# Patient Record
Sex: Male | Born: 1935 | Race: White | Hispanic: No | Marital: Married
Health system: Southern US, Community
[De-identification: ages and names within clinical notes are randomized; demographics above are authoritative.]

---

## 2005-03-07 ENCOUNTER — Ambulatory Visit: Payer: Self-pay | Admitting: General Practice

## 2005-03-07 ENCOUNTER — Other Ambulatory Visit: Payer: Self-pay

## 2005-03-08 ENCOUNTER — Ambulatory Visit: Payer: Self-pay | Admitting: General Practice

## 2005-03-10 ENCOUNTER — Ambulatory Visit: Payer: Self-pay | Admitting: General Practice

## 2005-03-18 ENCOUNTER — Ambulatory Visit: Payer: Self-pay | Admitting: General Practice

## 2005-08-17 ENCOUNTER — Emergency Department: Payer: Self-pay | Admitting: General Practice

## 2005-10-01 ENCOUNTER — Ambulatory Visit: Payer: Self-pay | Admitting: Internal Medicine

## 2005-10-09 ENCOUNTER — Ambulatory Visit: Payer: Self-pay | Admitting: General Practice

## 2005-10-21 ENCOUNTER — Ambulatory Visit: Payer: Self-pay | Admitting: General Practice

## 2006-11-02 ENCOUNTER — Ambulatory Visit: Payer: Self-pay | Admitting: General Practice

## 2006-11-13 ENCOUNTER — Other Ambulatory Visit: Payer: Self-pay

## 2006-11-13 ENCOUNTER — Ambulatory Visit: Payer: Self-pay | Admitting: Orthopaedic Surgery

## 2006-11-17 ENCOUNTER — Ambulatory Visit: Payer: Self-pay | Admitting: Orthopaedic Surgery

## 2008-08-06 ENCOUNTER — Ambulatory Visit: Payer: Self-pay | Admitting: Family Medicine

## 2009-01-26 ENCOUNTER — Ambulatory Visit: Payer: Self-pay | Admitting: Internal Medicine

## 2009-04-29 ENCOUNTER — Ambulatory Visit: Payer: Self-pay | Admitting: Internal Medicine

## 2009-08-29 ENCOUNTER — Ambulatory Visit: Payer: Self-pay | Admitting: Internal Medicine

## 2011-12-10 ENCOUNTER — Ambulatory Visit: Payer: Self-pay | Admitting: Physician Assistant

## 2011-12-11 ENCOUNTER — Ambulatory Visit: Payer: Self-pay | Admitting: Physician Assistant

## 2011-12-18 ENCOUNTER — Ambulatory Visit: Payer: Self-pay | Admitting: Specialist

## 2011-12-22 ENCOUNTER — Ambulatory Visit: Payer: Self-pay | Admitting: Internal Medicine

## 2011-12-25 ENCOUNTER — Ambulatory Visit: Payer: Self-pay | Admitting: Specialist

## 2011-12-26 LAB — PATHOLOGY REPORT

## 2011-12-30 LAB — CBC CANCER CENTER
Basophil #: 0.1 x10 3/mm (ref 0.0–0.1)
Basophil %: 0.7 %
Eosinophil #: 0.2 x10 3/mm (ref 0.0–0.7)
Eosinophil %: 2.6 %
HGB: 14.2 g/dL (ref 13.0–18.0)
Lymphocyte %: 21.3 %
MCH: 30.7 pg (ref 26.0–34.0)
MCHC: 34.3 g/dL (ref 32.0–36.0)
Monocyte #: 0.5 x10 3/mm (ref 0.2–1.0)
Monocyte %: 6.6 %
Neutrophil #: 5.6 x10 3/mm (ref 1.4–6.5)
Neutrophil %: 68.8 %
RDW: 14.4 % (ref 11.5–14.5)
WBC: 8.2 x10 3/mm (ref 3.8–10.6)

## 2011-12-30 LAB — COMPREHENSIVE METABOLIC PANEL
Anion Gap: 9 (ref 7–16)
BUN: 16 mg/dL (ref 7–18)
Chloride: 102 mmol/L (ref 98–107)
Co2: 28 mmol/L (ref 21–32)
Creatinine: 1.08 mg/dL (ref 0.60–1.30)
EGFR (African American): >60
EGFR (Non-African Amer.): >60
Glucose: 200 mg/dL — ABNORMAL HIGH (ref 65–99)
Potassium: 4.6 mmol/L (ref 3.5–5.1)
SGOT(AST): 78 U/L — ABNORMAL HIGH (ref 15–37)
SGPT (ALT): 74 U/L
Sodium: 139 mmol/L (ref 136–145)

## 2011-12-30 LAB — LACTATE DEHYDROGENASE: LDH: 491 U/L — ABNORMAL HIGH (ref 81–234)

## 2011-12-30 LAB — APTT: Activated PTT: 27.2 secs (ref 23.6–35.9)

## 2012-01-01 ENCOUNTER — Ambulatory Visit: Payer: Self-pay | Admitting: Internal Medicine

## 2012-01-07 LAB — CBC CANCER CENTER
Basophil %: 0.6 %
Eosinophil %: 6.8 %
HGB: 13.3 g/dL (ref 13.0–18.0)
Lymphocyte %: 59.5 %
MCV: 91 fL (ref 80–100)
Neutrophil %: 31.3 %
RBC: 4.36 10*6/uL — ABNORMAL LOW (ref 4.40–5.90)
WBC: 1.8 x10 3/mm — CL (ref 3.8–10.6)

## 2012-01-07 LAB — CREATININE, SERUM
EGFR (African American): >60
EGFR (Non-African Amer.): >60

## 2012-01-14 LAB — CBC CANCER CENTER
Basophil %: 0.6 %
HCT: 36 % — ABNORMAL LOW (ref 40.0–52.0)
HGB: 12.2 g/dL — ABNORMAL LOW (ref 13.0–18.0)
Lymphocyte %: 32.9 %
MCHC: 33.9 g/dL (ref 32.0–36.0)
Monocyte %: 11.6 %
Neutrophil #: 2.4 x10 3/mm (ref 1.4–6.5)
Neutrophil %: 50.6 %
RBC: 4.02 10*6/uL — ABNORMAL LOW (ref 4.40–5.90)
WBC: 4.7 x10 3/mm (ref 3.8–10.6)

## 2012-01-14 LAB — BASIC METABOLIC PANEL
Calcium, Total: 9 mg/dL (ref 8.5–10.1)
Chloride: 101 mmol/L (ref 98–107)
Co2: 30 mmol/L (ref 21–32)
EGFR (Non-African Amer.): >60
Glucose: 203 mg/dL — ABNORMAL HIGH (ref 65–99)
Osmolality: 280 (ref 275–301)
Potassium: 3.8 mmol/L (ref 3.5–5.1)
Sodium: 137 mmol/L (ref 136–145)

## 2012-01-14 LAB — HEPATIC FUNCTION PANEL A (ARMC)
Albumin: 3.4 g/dL (ref 3.4–5.0)
Alkaline Phosphatase: 83 U/L (ref 50–136)
Bilirubin, Direct: 0.1 mg/dL (ref 0.00–0.20)
Bilirubin,Total: 0.4 mg/dL (ref 0.2–1.0)
Total Protein: 7.1 g/dL (ref 6.4–8.2)

## 2012-01-21 LAB — CBC CANCER CENTER
Basophil #: 0 x10 3/mm (ref 0.0–0.1)
Basophil %: 0.8 %
Eosinophil %: 0.5 %
HCT: 34.3 % — ABNORMAL LOW (ref 40.0–52.0)
HGB: 11.5 g/dL — ABNORMAL LOW (ref 13.0–18.0)
Lymphocyte %: 21.6 %
MCH: 30 pg (ref 26.0–34.0)
MCV: 90 fL (ref 80–100)
Monocyte #: 0.8 x10 3/mm (ref 0.2–1.0)
Monocyte %: 15.2 %
Neutrophil #: 3.4 x10 3/mm (ref 1.4–6.5)
Platelet: 101 x10 3/mm — ABNORMAL LOW (ref 150–440)
RBC: 3.83 10*6/uL — ABNORMAL LOW (ref 4.40–5.90)
RDW: 14.4 % (ref 11.5–14.5)
WBC: 5.6 x10 3/mm (ref 3.8–10.6)

## 2012-01-21 LAB — BASIC METABOLIC PANEL
Calcium, Total: 8.9 mg/dL (ref 8.5–10.1)
Chloride: 102 mmol/L (ref 98–107)
Co2: 26 mmol/L (ref 21–32)
Creatinine: 1.27 mg/dL (ref 0.60–1.30)
Sodium: 137 mmol/L (ref 136–145)

## 2012-01-28 LAB — CBC CANCER CENTER
Basophil #: 0 x10 3/mm (ref 0.0–0.1)
Basophil %: 1 %
Eosinophil #: 0 x10 3/mm (ref 0.0–0.7)
HGB: 11.1 g/dL — ABNORMAL LOW (ref 13.0–18.0)
Lymphocyte #: 1 x10 3/mm (ref 1.0–3.6)
MCH: 30.9 pg (ref 26.0–34.0)
MCHC: 34.1 g/dL (ref 32.0–36.0)
MCV: 91 fL (ref 80–100)
Monocyte #: 0.4 x10 3/mm (ref 0.2–1.0)
Neutrophil %: 65.2 %

## 2012-02-01 ENCOUNTER — Ambulatory Visit: Payer: Self-pay | Admitting: Internal Medicine

## 2012-02-04 LAB — BASIC METABOLIC PANEL
Anion Gap: 9 (ref 7–16)
Calcium, Total: 8.7 mg/dL (ref 8.5–10.1)
Chloride: 100 mmol/L (ref 98–107)
Co2: 25 mmol/L (ref 21–32)
EGFR (African American): >60
Sodium: 134 mmol/L — ABNORMAL LOW (ref 136–145)

## 2012-02-04 LAB — HEPATIC FUNCTION PANEL A (ARMC)
Albumin: 3.3 g/dL — ABNORMAL LOW (ref 3.4–5.0)
Alkaline Phosphatase: 96 U/L (ref 50–136)
Bilirubin, Direct: 0.1 mg/dL (ref 0.00–0.20)
Bilirubin,Total: 0.5 mg/dL (ref 0.2–1.0)
SGPT (ALT): 106 U/L — ABNORMAL HIGH (ref 12–78)

## 2012-02-04 LAB — CBC CANCER CENTER
Eosinophil #: 0 x10 3/mm (ref 0.0–0.7)
HCT: 33.5 % — ABNORMAL LOW (ref 40.0–52.0)
Lymphocyte #: 1.6 x10 3/mm (ref 1.0–3.6)
MCH: 30.8 pg (ref 26.0–34.0)
MCHC: 34 g/dL (ref 32.0–36.0)
MCV: 91 fL (ref 80–100)
Monocyte #: 0.7 x10 3/mm (ref 0.2–1.0)
Neutrophil #: 4.6 x10 3/mm (ref 1.4–6.5)
Platelet: 71 x10 3/mm — ABNORMAL LOW (ref 150–440)
RDW: 15.5 % — ABNORMAL HIGH (ref 11.5–14.5)
WBC: 7 x10 3/mm (ref 3.8–10.6)

## 2012-02-11 LAB — BASIC METABOLIC PANEL
Anion Gap: 8 (ref 7–16)
BUN: 18 mg/dL (ref 7–18)
Chloride: 101 mmol/L (ref 98–107)
Creatinine: 1.2 mg/dL (ref 0.60–1.30)
EGFR (African American): >60
EGFR (Non-African Amer.): 58 — ABNORMAL LOW
Glucose: 213 mg/dL — ABNORMAL HIGH (ref 65–99)
Osmolality: 282 (ref 275–301)
Potassium: 4 mmol/L (ref 3.5–5.1)

## 2012-02-11 LAB — CBC CANCER CENTER
Basophil #: 0.1 x10 3/mm (ref 0.0–0.1)
Basophil %: 0.9 %
Eosinophil %: 0.9 %
HCT: 34.5 % — ABNORMAL LOW (ref 40.0–52.0)
HGB: 11.4 g/dL — ABNORMAL LOW (ref 13.0–18.0)
Lymphocyte %: 20.7 %
MCH: 30.4 pg (ref 26.0–34.0)
MCHC: 33.2 g/dL (ref 32.0–36.0)
Monocyte %: 11.9 %
Neutrophil #: 5.7 x10 3/mm (ref 1.4–6.5)
Neutrophil %: 65.6 %
RBC: 3.76 10*6/uL — ABNORMAL LOW (ref 4.40–5.90)
WBC: 8.7 x10 3/mm (ref 3.8–10.6)

## 2012-02-18 LAB — CBC CANCER CENTER
Basophil #: 0.1 x10 3/mm (ref 0.0–0.1)
Basophil %: 1 %
Eosinophil #: 0.1 x10 3/mm (ref 0.0–0.7)
Eosinophil %: 0.8 %
HCT: 32.5 % — ABNORMAL LOW (ref 40.0–52.0)
HGB: 10.9 g/dL — ABNORMAL LOW (ref 13.0–18.0)
Lymphocyte #: 1.3 x10 3/mm (ref 1.0–3.6)
Lymphocyte %: 16.4 %
MCH: 31 pg (ref 26.0–34.0)
MCV: 92 fL (ref 80–100)
Monocyte #: 0.7 x10 3/mm (ref 0.2–1.0)
Monocyte %: 9.4 %
Neutrophil #: 5.5 x10 3/mm (ref 1.4–6.5)
Platelet: 84 x10 3/mm — ABNORMAL LOW (ref 150–440)
RBC: 3.52 10*6/uL — ABNORMAL LOW (ref 4.40–5.90)
WBC: 7.7 x10 3/mm (ref 3.8–10.6)

## 2012-02-18 LAB — BASIC METABOLIC PANEL
Anion Gap: 6 — ABNORMAL LOW (ref 7–16)
Calcium, Total: 8.8 mg/dL (ref 8.5–10.1)
Chloride: 100 mmol/L (ref 98–107)
Co2: 30 mmol/L (ref 21–32)
EGFR (African American): >60
EGFR (Non-African Amer.): 53 — ABNORMAL LOW
Glucose: 282 mg/dL — ABNORMAL HIGH (ref 65–99)
Osmolality: 283 (ref 275–301)

## 2012-02-25 LAB — CBC CANCER CENTER
Basophil #: 0.1 x10 3/mm (ref 0.0–0.1)
Basophil %: 1.3 %
Eosinophil #: 0.1 x10 3/mm (ref 0.0–0.7)
HCT: 33 % — ABNORMAL LOW (ref 40.0–52.0)
HGB: 11 g/dL — ABNORMAL LOW (ref 13.0–18.0)
Lymphocyte %: 21.2 %
MCH: 31.2 pg (ref 26.0–34.0)
MCHC: 33.3 g/dL (ref 32.0–36.0)
MCV: 94 fL (ref 80–100)
Monocyte %: 9.9 %
Neutrophil #: 4.5 x10 3/mm (ref 1.4–6.5)
Neutrophil %: 65.6 %
Platelet: 133 x10 3/mm — ABNORMAL LOW (ref 150–440)
RDW: 19.4 % — ABNORMAL HIGH (ref 11.5–14.5)
WBC: 6.9 x10 3/mm (ref 3.8–10.6)

## 2012-02-25 LAB — BASIC METABOLIC PANEL
Anion Gap: 10 (ref 7–16)
Chloride: 101 mmol/L (ref 98–107)
Co2: 27 mmol/L (ref 21–32)
Creatinine: 1.19 mg/dL (ref 0.60–1.30)
Osmolality: 282 (ref 275–301)
Potassium: 4.1 mmol/L (ref 3.5–5.1)

## 2012-03-02 ENCOUNTER — Ambulatory Visit: Payer: Self-pay | Admitting: Internal Medicine

## 2012-03-03 LAB — CBC CANCER CENTER
Basophil #: 0 x10 3/mm (ref 0.0–0.1)
Eosinophil %: 6.8 %
HGB: 10.8 g/dL — ABNORMAL LOW (ref 13.0–18.0)
Lymphocyte #: 1.1 x10 3/mm (ref 1.0–3.6)
Monocyte #: 0.1 x10 3/mm — ABNORMAL LOW (ref 0.2–1.0)
Monocyte %: 2.3 %
Neutrophil %: 37.7 %
Platelet: 50 x10 3/mm — ABNORMAL LOW (ref 150–440)
RBC: 3.41 10*6/uL — ABNORMAL LOW (ref 4.40–5.90)
RDW: 18.2 % — ABNORMAL HIGH (ref 11.5–14.5)
WBC: 2.2 x10 3/mm — ABNORMAL LOW (ref 3.8–10.6)

## 2012-03-10 LAB — CBC CANCER CENTER
Eosinophil #: 0.1 x10 3/mm (ref 0.0–0.7)
HCT: 27.8 % — ABNORMAL LOW (ref 40.0–52.0)
Lymphocyte #: 1.2 x10 3/mm (ref 1.0–3.6)
Lymphocyte %: 44.9 %
MCH: 32.9 pg (ref 26.0–34.0)
MCHC: 34.3 g/dL (ref 32.0–36.0)
MCV: 96 fL (ref 80–100)
Monocyte #: 0.3 x10 3/mm (ref 0.2–1.0)
Monocyte %: 9.4 %
Neutrophil #: 1.1 x10 3/mm — ABNORMAL LOW (ref 1.4–6.5)
Platelet: 26 x10 3/mm — CL (ref 150–440)
RDW: 18.3 % — ABNORMAL HIGH (ref 11.5–14.5)
WBC: 2.7 x10 3/mm — ABNORMAL LOW (ref 3.8–10.6)

## 2012-03-10 LAB — BASIC METABOLIC PANEL
Anion Gap: 10 (ref 7–16)
BUN: 14 mg/dL (ref 7–18)
Chloride: 100 mmol/L (ref 98–107)
Co2: 28 mmol/L (ref 21–32)
Creatinine: 1.17 mg/dL (ref 0.60–1.30)
EGFR (Non-African Amer.): 59 — ABNORMAL LOW
Osmolality: 292 (ref 275–301)
Potassium: 4.2 mmol/L (ref 3.5–5.1)

## 2012-03-10 LAB — HEPATIC FUNCTION PANEL A (ARMC)
Albumin: 3.3 g/dL — ABNORMAL LOW (ref 3.4–5.0)
Alkaline Phosphatase: 79 U/L (ref 50–136)
SGOT(AST): 49 U/L — ABNORMAL HIGH (ref 15–37)
Total Protein: 6.9 g/dL (ref 6.4–8.2)

## 2012-03-17 LAB — CBC CANCER CENTER
Basophil #: 0 x10 3/mm (ref 0.0–0.1)
Basophil %: 0.8 %
Eosinophil #: 0 x10 3/mm (ref 0.0–0.7)
Lymphocyte #: 1.8 x10 3/mm (ref 1.0–3.6)
MCH: 31.9 pg (ref 26.0–34.0)
MCHC: 33.4 g/dL (ref 32.0–36.0)
MCV: 95 fL (ref 80–100)
Monocyte #: 0.9 x10 3/mm (ref 0.2–1.0)
Neutrophil %: 51.6 %
Platelet: 86 x10 3/mm — ABNORMAL LOW (ref 150–440)
RDW: 19.2 % — ABNORMAL HIGH (ref 11.5–14.5)

## 2012-03-17 LAB — BASIC METABOLIC PANEL
Calcium, Total: 9 mg/dL (ref 8.5–10.1)
Chloride: 100 mmol/L (ref 98–107)
Co2: 21 mmol/L (ref 21–32)
Glucose: 286 mg/dL — ABNORMAL HIGH (ref 65–99)
Osmolality: 286 (ref 275–301)
Potassium: 4.3 mmol/L (ref 3.5–5.1)
Sodium: 137 mmol/L (ref 136–145)

## 2012-03-24 LAB — BASIC METABOLIC PANEL
Calcium, Total: 8.8 mg/dL (ref 8.5–10.1)
Chloride: 98 mmol/L (ref 98–107)
Creatinine: 1.17 mg/dL (ref 0.60–1.30)
EGFR (African American): >60
EGFR (Non-African Amer.): 59 — ABNORMAL LOW
Glucose: 300 mg/dL — ABNORMAL HIGH (ref 65–99)
Osmolality: 278 (ref 275–301)
Potassium: 4.1 mmol/L (ref 3.5–5.1)

## 2012-03-24 LAB — CBC CANCER CENTER
Basophil #: 0.1 x10 3/mm (ref 0.0–0.1)
Eosinophil #: 0 x10 3/mm (ref 0.0–0.7)
Eosinophil %: 0.3 %
HCT: 29.8 % — ABNORMAL LOW (ref 40.0–52.0)
HGB: 10.2 g/dL — ABNORMAL LOW (ref 13.0–18.0)
Lymphocyte %: 27.6 %
MCH: 33.3 pg (ref 26.0–34.0)
MCHC: 34.3 g/dL (ref 32.0–36.0)
MCV: 97 fL (ref 80–100)
Monocyte #: 0.9 x10 3/mm (ref 0.2–1.0)
Neutrophil #: 3.3 x10 3/mm (ref 1.4–6.5)
Neutrophil %: 56.3 %
Platelet: 139 x10 3/mm — ABNORMAL LOW (ref 150–440)
RBC: 3.06 10*6/uL — ABNORMAL LOW (ref 4.40–5.90)
RDW: 20.8 % — ABNORMAL HIGH (ref 11.5–14.5)
WBC: 5.9 x10 3/mm (ref 3.8–10.6)

## 2012-03-31 LAB — CBC CANCER CENTER
Basophil #: 0 x10 3/mm (ref 0.0–0.1)
Basophil %: 0.6 %
Eosinophil #: 0 x10 3/mm (ref 0.0–0.7)
HCT: 30.3 % — ABNORMAL LOW (ref 40.0–52.0)
HGB: 10.3 g/dL — ABNORMAL LOW (ref 13.0–18.0)
MCH: 33.2 pg (ref 26.0–34.0)
MCHC: 33.9 g/dL (ref 32.0–36.0)
Monocyte #: 0.1 x10 3/mm — ABNORMAL LOW (ref 0.2–1.0)
Monocyte %: 3.2 %
Neutrophil %: 64.8 %
Platelet: 77 x10 3/mm — ABNORMAL LOW (ref 150–440)
RDW: 18.5 % — ABNORMAL HIGH (ref 11.5–14.5)
WBC: 3.6 x10 3/mm — ABNORMAL LOW (ref 3.8–10.6)

## 2012-04-02 ENCOUNTER — Ambulatory Visit: Payer: Self-pay | Admitting: Internal Medicine

## 2012-04-07 LAB — CBC CANCER CENTER
Basophil #: 0 x10 3/mm (ref 0.0–0.1)
Eosinophil #: 0 x10 3/mm (ref 0.0–0.7)
Eosinophil %: 0.3 %
Lymphocyte %: 16.6 %
MCHC: 32.9 g/dL (ref 32.0–36.0)
MCV: 100 fL (ref 80–100)
Monocyte #: 0.3 x10 3/mm (ref 0.2–1.0)
Neutrophil #: 5.9 x10 3/mm (ref 1.4–6.5)
Neutrophil %: 78.4 %
RDW: 18.6 % — ABNORMAL HIGH (ref 11.5–14.5)
WBC: 7.6 x10 3/mm (ref 3.8–10.6)

## 2012-04-07 LAB — HEPATIC FUNCTION PANEL A (ARMC)
Albumin: 3.5 g/dL (ref 3.4–5.0)
Alkaline Phosphatase: 73 U/L (ref 50–136)
Bilirubin, Direct: 0.2 mg/dL (ref 0.00–0.20)
Bilirubin,Total: 0.5 mg/dL (ref 0.2–1.0)
SGOT(AST): 50 U/L — ABNORMAL HIGH (ref 15–37)
Total Protein: 7 g/dL (ref 6.4–8.2)

## 2012-04-07 LAB — BASIC METABOLIC PANEL
Anion Gap: 14 (ref 7–16)
BUN: 21 mg/dL — ABNORMAL HIGH (ref 7–18)
Calcium, Total: 8.9 mg/dL (ref 8.5–10.1)
Creatinine: 1.43 mg/dL — ABNORMAL HIGH (ref 0.60–1.30)
EGFR (African American): 54 — ABNORMAL LOW
Glucose: 361 mg/dL — ABNORMAL HIGH (ref 65–99)
Osmolality: 288 (ref 275–301)

## 2012-04-14 LAB — CBC CANCER CENTER
Basophil %: 0.7 %
Eosinophil #: 0 x10 3/mm (ref 0.0–0.7)
HCT: 29.7 % — ABNORMAL LOW (ref 40.0–52.0)
HGB: 10 g/dL — ABNORMAL LOW (ref 13.0–18.0)
Lymphocyte #: 1.7 x10 3/mm (ref 1.0–3.6)
Lymphocyte %: 21.6 %
MCHC: 33.8 g/dL (ref 32.0–36.0)
MCV: 100 fL (ref 80–100)
Monocyte %: 12.6 %
Neutrophil #: 5.1 x10 3/mm (ref 1.4–6.5)
WBC: 8 x10 3/mm (ref 3.8–10.6)

## 2012-04-21 LAB — CBC CANCER CENTER
Eosinophil %: 0.2 %
HCT: 31.7 % — ABNORMAL LOW (ref 40.0–52.0)
HGB: 10.4 g/dL — ABNORMAL LOW (ref 13.0–18.0)
Lymphocyte #: 1.6 x10 3/mm (ref 1.0–3.6)
MCH: 33.2 pg (ref 26.0–34.0)
MCHC: 32.9 g/dL (ref 32.0–36.0)
MCV: 101 fL — ABNORMAL HIGH (ref 80–100)
Monocyte #: 0.9 x10 3/mm (ref 0.2–1.0)
Monocyte %: 10.6 %
Neutrophil #: 5.6 x10 3/mm (ref 1.4–6.5)
RBC: 3.14 10*6/uL — ABNORMAL LOW (ref 4.40–5.90)

## 2012-04-21 LAB — BASIC METABOLIC PANEL
Anion Gap: 10 (ref 7–16)
Calcium, Total: 9.9 mg/dL (ref 8.5–10.1)
Chloride: 100 mmol/L (ref 98–107)
Co2: 26 mmol/L (ref 21–32)
Creatinine: 1.14 mg/dL (ref 0.60–1.30)
Sodium: 136 mmol/L (ref 136–145)

## 2012-05-02 ENCOUNTER — Ambulatory Visit: Payer: Self-pay | Admitting: Internal Medicine

## 2012-05-03 LAB — CBC CANCER CENTER
Basophil %: 0.7 %
Eosinophil #: 0.1 x10 3/mm (ref 0.0–0.7)
Lymphocyte #: 1.5 x10 3/mm (ref 1.0–3.6)
MCH: 33.2 pg (ref 26.0–34.0)
MCHC: 33.2 g/dL (ref 32.0–36.0)
MCV: 100 fL (ref 80–100)
Monocyte #: 0.5 x10 3/mm (ref 0.2–1.0)
Monocyte %: 7.5 %
Neutrophil #: 5 x10 3/mm (ref 1.4–6.5)
Neutrophil %: 69.1 %
Platelet: 117 x10 3/mm — ABNORMAL LOW (ref 150–440)

## 2012-05-03 LAB — BASIC METABOLIC PANEL
Anion Gap: 10 (ref 7–16)
BUN: 22 mg/dL — ABNORMAL HIGH (ref 7–18)
Calcium, Total: 9.5 mg/dL (ref 8.5–10.1)
Chloride: 102 mmol/L (ref 98–107)
Creatinine: 1.15 mg/dL (ref 0.60–1.30)
EGFR (African American): >60
EGFR (Non-African Amer.): >60
Glucose: 220 mg/dL — ABNORMAL HIGH (ref 65–99)

## 2012-05-10 LAB — CBC CANCER CENTER
Basophil #: 0 x10 3/mm (ref 0.0–0.1)
Basophil %: 1.2 %
Eosinophil #: 0.1 x10 3/mm (ref 0.0–0.7)
Eosinophil %: 4.1 %
HCT: 29.8 % — ABNORMAL LOW (ref 40.0–52.0)
HGB: 10.1 g/dL — ABNORMAL LOW (ref 13.0–18.0)
Lymphocyte #: 1.1 x10 3/mm (ref 1.0–3.6)
Lymphocyte %: 38.4 %
MCV: 98 fL (ref 80–100)
Monocyte %: 1.9 %
Neutrophil #: 1.6 x10 3/mm (ref 1.4–6.5)
Neutrophil %: 54.4 %
RBC: 3.04 10*6/uL — ABNORMAL LOW (ref 4.40–5.90)
RDW: 16.2 % — ABNORMAL HIGH (ref 11.5–14.5)
WBC: 2.9 x10 3/mm — ABNORMAL LOW (ref 3.8–10.6)

## 2012-05-17 LAB — BASIC METABOLIC PANEL
Anion Gap: 7 (ref 7–16)
BUN: 18 mg/dL (ref 7–18)
Co2: 32 mmol/L (ref 21–32)
Creatinine: 1.18 mg/dL (ref 0.60–1.30)
EGFR (Non-African Amer.): 59 — ABNORMAL LOW
Glucose: 217 mg/dL — ABNORMAL HIGH (ref 65–99)
Osmolality: 284 (ref 275–301)
Potassium: 4.1 mmol/L (ref 3.5–5.1)
Sodium: 138 mmol/L (ref 136–145)

## 2012-05-17 LAB — HEPATIC FUNCTION PANEL A (ARMC)
Albumin: 3.7 g/dL (ref 3.4–5.0)
Bilirubin, Direct: 0.2 mg/dL (ref 0.00–0.20)
Bilirubin,Total: 0.6 mg/dL (ref 0.2–1.0)
SGOT(AST): 103 U/L — ABNORMAL HIGH (ref 15–37)
Total Protein: 7.6 g/dL (ref 6.4–8.2)

## 2012-05-17 LAB — CBC CANCER CENTER
Eosinophil %: 2.4 %
Lymphocyte #: 1.6 x10 3/mm (ref 1.0–3.6)
Lymphocyte %: 38 %
MCV: 97 fL (ref 80–100)
Monocyte %: 10.1 %
Neutrophil %: 48.6 %
Platelet: 21 x10 3/mm — CL (ref 150–440)
RBC: 2.97 10*6/uL — ABNORMAL LOW (ref 4.40–5.90)
RDW: 15.9 % — ABNORMAL HIGH (ref 11.5–14.5)
WBC: 4.3 x10 3/mm (ref 3.8–10.6)

## 2012-05-24 LAB — CBC CANCER CENTER
Basophil #: 0 x10 3/mm (ref 0.0–0.1)
Eosinophil %: 0.6 %
HCT: 27.4 % — ABNORMAL LOW (ref 40.0–52.0)
Lymphocyte #: 2 x10 3/mm (ref 1.0–3.6)
Lymphocyte %: 30.6 %
MCH: 34.1 pg — ABNORMAL HIGH (ref 26.0–34.0)
MCV: 98 fL (ref 80–100)
Monocyte #: 0.6 x10 3/mm (ref 0.2–1.0)
Monocyte %: 8.8 %
Neutrophil %: 59.6 %
Platelet: 41 x10 3/mm — ABNORMAL LOW (ref 150–440)
RBC: 2.81 10*6/uL — ABNORMAL LOW (ref 4.40–5.90)
RDW: 16.5 % — ABNORMAL HIGH (ref 11.5–14.5)
WBC: 6.5 x10 3/mm (ref 3.8–10.6)

## 2012-05-29 ENCOUNTER — Ambulatory Visit: Payer: Self-pay | Admitting: Family Medicine

## 2012-05-29 ENCOUNTER — Emergency Department: Payer: Self-pay | Admitting: Unknown Physician Specialty

## 2012-05-29 LAB — URINALYSIS, COMPLETE
Bacteria: NEGATIVE
Bilirubin,UR: NEGATIVE
Ketone: NEGATIVE
Protein: NEGATIVE

## 2012-05-29 LAB — CBC WITH DIFFERENTIAL/PLATELET
Basophil #: 0 10*3/uL (ref 0.0–0.1)
Basophil %: 0.4 %
HGB: 9.1 g/dL — ABNORMAL LOW (ref 13.0–18.0)
Lymphocyte #: 1.5 10*3/uL (ref 1.0–3.6)
MCH: 33.6 pg (ref 26.0–34.0)
MCHC: 33.9 g/dL (ref 32.0–36.0)
MCV: 99 fL (ref 80–100)
Neutrophil %: 62.7 %
Platelet: 85 10*3/uL — ABNORMAL LOW (ref 150–440)

## 2012-05-29 LAB — COMPREHENSIVE METABOLIC PANEL
Alkaline Phosphatase: 85 U/L (ref 50–136)
Anion Gap: 8 (ref 7–16)
BUN: 17 mg/dL (ref 7–18)
Bilirubin,Total: 0.7 mg/dL (ref 0.2–1.0)
Calcium, Total: 9.3 mg/dL (ref 8.5–10.1)
Co2: 27 mmol/L (ref 21–32)
Creatinine: 1.07 mg/dL (ref 0.60–1.30)
EGFR (African American): >60
Glucose: 333 mg/dL — ABNORMAL HIGH (ref 65–99)
Osmolality: 281 (ref 275–301)
Potassium: 4.6 mmol/L (ref 3.5–5.1)
SGPT (ALT): 73 U/L (ref 12–78)
Sodium: 133 mmol/L — ABNORMAL LOW (ref 136–145)

## 2012-05-29 LAB — MAGNESIUM: Magnesium: 1.8 mg/dL

## 2012-05-29 LAB — TROPONIN I
Troponin-I: <0.02 ng/mL
Troponin-I: <0.02 ng/mL

## 2012-06-02 ENCOUNTER — Ambulatory Visit: Payer: Self-pay | Admitting: Internal Medicine

## 2012-06-07 LAB — BASIC METABOLIC PANEL
Anion Gap: 12 (ref 7–16)
BUN: 22 mg/dL — ABNORMAL HIGH (ref 7–18)
Calcium, Total: 9 mg/dL (ref 8.5–10.1)
Chloride: 100 mmol/L (ref 98–107)
Creatinine: 1.27 mg/dL (ref 0.60–1.30)
EGFR (African American): >60
EGFR (Non-African Amer.): 54 — ABNORMAL LOW
Glucose: 271 mg/dL — ABNORMAL HIGH (ref 65–99)
Potassium: 4.7 mmol/L (ref 3.5–5.1)

## 2012-06-07 LAB — CBC CANCER CENTER
Basophil %: 0.6 %
Eosinophil #: 0 x10 3/mm (ref 0.0–0.7)
Eosinophil %: 0.2 %
HCT: 30.5 % — ABNORMAL LOW (ref 40.0–52.0)
Lymphocyte #: 1.5 x10 3/mm (ref 1.0–3.6)
Lymphocyte %: 20.4 %
MCHC: 33.5 g/dL (ref 32.0–36.0)
Monocyte #: 0.8 x10 3/mm (ref 0.2–1.0)
Neutrophil #: 4.9 x10 3/mm (ref 1.4–6.5)
Neutrophil %: 68.3 %
WBC: 7.2 x10 3/mm (ref 3.8–10.6)

## 2012-06-14 LAB — CBC CANCER CENTER
Basophil %: 0.7 %
Eosinophil %: 1.1 %
HCT: 28.9 % — ABNORMAL LOW (ref 40.0–52.0)
HGB: 9.9 g/dL — ABNORMAL LOW (ref 13.0–18.0)
Lymphocyte %: 20 %
MCH: 33.3 pg (ref 26.0–34.0)
MCHC: 34.3 g/dL (ref 32.0–36.0)
MCV: 97 fL (ref 80–100)
Neutrophil #: 4.3 x10 3/mm (ref 1.4–6.5)
Neutrophil %: 72.1 %
RBC: 2.99 10*6/uL — ABNORMAL LOW (ref 4.40–5.90)
RDW: 17.4 % — ABNORMAL HIGH (ref 11.5–14.5)
WBC: 6 x10 3/mm (ref 3.8–10.6)

## 2012-06-21 LAB — CBC CANCER CENTER
Basophil %: 0.9 %
Eosinophil #: 0.1 x10 3/mm (ref 0.0–0.7)
HGB: 10 g/dL — ABNORMAL LOW (ref 13.0–18.0)
MCH: 33.3 pg (ref 26.0–34.0)
MCHC: 34.2 g/dL (ref 32.0–36.0)
Monocyte %: 11.9 %
Platelet: 66 x10 3/mm — ABNORMAL LOW (ref 150–440)
RBC: 3.01 10*6/uL — ABNORMAL LOW (ref 4.40–5.90)
RDW: 18 % — ABNORMAL HIGH (ref 11.5–14.5)

## 2012-06-28 LAB — CBC CANCER CENTER
Basophil #: 0 x10 3/mm (ref 0.0–0.1)
Basophil %: 0.2 %
Eosinophil #: 0 x10 3/mm (ref 0.0–0.7)
Eosinophil %: 0.1 %
HGB: 10.3 g/dL — ABNORMAL LOW (ref 13.0–18.0)
Lymphocyte #: 1.1 x10 3/mm (ref 1.0–3.6)
Lymphocyte %: 9.7 %
MCH: 32.6 pg (ref 26.0–34.0)
MCHC: 32.6 g/dL (ref 32.0–36.0)
MCV: 100 fL (ref 80–100)
Monocyte #: 0.2 x10 3/mm (ref 0.2–1.0)
Monocyte %: 1.6 %
Platelet: 73 x10 3/mm — ABNORMAL LOW (ref 150–440)
RBC: 3.14 10*6/uL — ABNORMAL LOW (ref 4.40–5.90)
RDW: 18.3 % — ABNORMAL HIGH (ref 11.5–14.5)

## 2012-06-28 LAB — BASIC METABOLIC PANEL
Anion Gap: 15 (ref 7–16)
BUN: 27 mg/dL — ABNORMAL HIGH (ref 7–18)
Chloride: 98 mmol/L (ref 98–107)
Co2: 22 mmol/L (ref 21–32)
EGFR (African American): 54 — ABNORMAL LOW
EGFR (Non-African Amer.): 47 — ABNORMAL LOW
Osmolality: 294 (ref 275–301)
Sodium: 135 mmol/L — ABNORMAL LOW (ref 136–145)

## 2012-06-28 LAB — HEPATIC FUNCTION PANEL A (ARMC)
Albumin: 3.5 g/dL (ref 3.4–5.0)
Alkaline Phosphatase: 111 U/L (ref 50–136)
Bilirubin, Direct: 0.3 mg/dL — ABNORMAL HIGH (ref 0.00–0.20)
Bilirubin,Total: 1.1 mg/dL — ABNORMAL HIGH (ref 0.2–1.0)
SGPT (ALT): 72 U/L (ref 12–78)
Total Protein: 7.8 g/dL (ref 6.4–8.2)

## 2012-07-02 LAB — COMPREHENSIVE METABOLIC PANEL
Albumin: 3.1 g/dL — ABNORMAL LOW (ref 3.4–5.0)
Anion Gap: 6 — ABNORMAL LOW (ref 7–16)
BUN: 18 mg/dL (ref 7–18)
Chloride: 101 mmol/L (ref 98–107)
Co2: 27 mmol/L (ref 21–32)
Creatinine: 0.86 mg/dL (ref 0.60–1.30)
EGFR (Non-African Amer.): >60
Glucose: 211 mg/dL — ABNORMAL HIGH (ref 65–99)
Osmolality: 276 (ref 275–301)
Potassium: 4.5 mmol/L (ref 3.5–5.1)
SGOT(AST): 68 U/L — ABNORMAL HIGH (ref 15–37)
SGPT (ALT): 53 U/L (ref 12–78)
Sodium: 134 mmol/L — ABNORMAL LOW (ref 136–145)

## 2012-07-02 LAB — URINALYSIS, COMPLETE
Bacteria: NONE SEEN
Leukocyte Esterase: NEGATIVE
Ph: 5 (ref 4.5–8.0)
RBC,UR: NONE SEEN /HPF (ref 0–5)
Specific Gravity: 1.016 (ref 1.003–1.030)
Squamous Epithelial: NONE SEEN
WBC UR: 1 /HPF (ref 0–5)

## 2012-07-02 LAB — CBC WITH DIFFERENTIAL/PLATELET
Basophil %: 0.3 %
Eosinophil #: 0 10*3/uL (ref 0.0–0.7)
HGB: 10 g/dL — ABNORMAL LOW (ref 13.0–18.0)
Lymphocyte #: 1.2 10*3/uL (ref 1.0–3.6)
MCHC: 33 g/dL (ref 32.0–36.0)
MCV: 99 fL (ref 80–100)
Neutrophil #: 10.9 10*3/uL — ABNORMAL HIGH (ref 1.4–6.5)
Platelet: 86 10*3/uL — ABNORMAL LOW (ref 150–440)
RBC: 3.06 10*6/uL — ABNORMAL LOW (ref 4.40–5.90)
WBC: 13.6 10*3/uL — ABNORMAL HIGH (ref 3.8–10.6)

## 2012-07-02 LAB — PROTIME-INR
INR: 1.1
Prothrombin Time: 14.6 secs (ref 11.5–14.7)

## 2012-07-02 LAB — APTT: Activated PTT: 36.3 secs — ABNORMAL HIGH (ref 23.6–35.9)

## 2012-07-03 ENCOUNTER — Ambulatory Visit: Payer: Self-pay | Admitting: Internal Medicine

## 2012-07-03 LAB — CBC WITH DIFFERENTIAL/PLATELET
Basophil #: 0 10*3/uL (ref 0.0–0.1)
Basophil %: 0.2 %
Eosinophil %: 0.5 %
HCT: 26.9 % — ABNORMAL LOW (ref 40.0–52.0)
HGB: 9.1 g/dL — ABNORMAL LOW (ref 13.0–18.0)
Lymphocyte %: 18.1 %
MCHC: 33.8 g/dL (ref 32.0–36.0)
Monocyte #: 1.2 x10 3/mm — ABNORMAL HIGH (ref 0.2–1.0)
RBC: 2.72 10*6/uL — ABNORMAL LOW (ref 4.40–5.90)
RDW: 18.5 % — ABNORMAL HIGH (ref 11.5–14.5)

## 2012-07-04 ENCOUNTER — Inpatient Hospital Stay: Payer: Self-pay | Admitting: Internal Medicine

## 2012-07-07 ENCOUNTER — Ambulatory Visit: Payer: Self-pay | Admitting: Internal Medicine

## 2012-07-08 LAB — CULTURE, BLOOD (SINGLE)

## 2012-07-12 LAB — CBC CANCER CENTER
Lymphocyte #: 1.3 x10 3/mm (ref 1.0–3.6)
MCH: 33.1 pg (ref 26.0–34.0)
MCV: 96 fL (ref 80–100)
Monocyte #: 0.9 x10 3/mm (ref 0.2–1.0)
Neutrophil %: 78 %
Platelet: 104 x10 3/mm — ABNORMAL LOW (ref 150–440)
RDW: 17.2 % — ABNORMAL HIGH (ref 11.5–14.5)
WBC: 10.4 x10 3/mm (ref 3.8–10.6)

## 2012-07-12 LAB — BASIC METABOLIC PANEL
Anion Gap: 10 (ref 7–16)
Co2: 29 mmol/L (ref 21–32)
Glucose: 213 mg/dL — ABNORMAL HIGH (ref 65–99)
Osmolality: 277 (ref 275–301)
Sodium: 135 mmol/L — ABNORMAL LOW (ref 136–145)

## 2012-07-19 LAB — BASIC METABOLIC PANEL
Anion Gap: 12 (ref 7–16)
BUN: 18 mg/dL (ref 7–18)
Chloride: 97 mmol/L — ABNORMAL LOW (ref 98–107)
Co2: 25 mmol/L (ref 21–32)
Creatinine: 1.25 mg/dL (ref 0.60–1.30)
EGFR (African American): >60
Glucose: 224 mg/dL — ABNORMAL HIGH (ref 65–99)
Osmolality: 277 (ref 275–301)
Potassium: 4.4 mmol/L (ref 3.5–5.1)

## 2012-07-19 LAB — CBC CANCER CENTER
Basophil #: 0 x10 3/mm (ref 0.0–0.1)
Basophil %: 0.5 %
Eosinophil %: 2 %
HCT: 30.2 % — ABNORMAL LOW (ref 40.0–52.0)
HGB: 10.3 g/dL — ABNORMAL LOW (ref 13.0–18.0)
MCH: 32.4 pg (ref 26.0–34.0)
MCHC: 34.1 g/dL (ref 32.0–36.0)
MCV: 95 fL (ref 80–100)
Monocyte #: 0.5 x10 3/mm (ref 0.2–1.0)
Monocyte %: 8.2 %
Neutrophil %: 64.1 %
Platelet: 115 x10 3/mm — ABNORMAL LOW (ref 150–440)
WBC: 6.4 x10 3/mm (ref 3.8–10.6)

## 2012-07-26 LAB — CBC CANCER CENTER
Basophil #: 0 x10 3/mm (ref 0.0–0.1)
HCT: 28.7 % — ABNORMAL LOW (ref 40.0–52.0)
Lymphocyte %: 32.2 %
MCH: 32.5 pg (ref 26.0–34.0)
MCHC: 34.1 g/dL (ref 32.0–36.0)
MCV: 95 fL (ref 80–100)
Monocyte #: 0.2 x10 3/mm (ref 0.2–1.0)
Neutrophil #: 2.2 x10 3/mm (ref 1.4–6.5)
Neutrophil %: 57.6 %
Platelet: 80 x10 3/mm — ABNORMAL LOW (ref 150–440)
RBC: 3.01 10*6/uL — ABNORMAL LOW (ref 4.40–5.90)
RDW: 17.5 % — ABNORMAL HIGH (ref 11.5–14.5)
WBC: 3.8 x10 3/mm (ref 3.8–10.6)

## 2012-07-31 ENCOUNTER — Ambulatory Visit: Payer: Self-pay | Admitting: Internal Medicine

## 2012-08-05 LAB — CBC CANCER CENTER
Basophil %: 1 %
Eosinophil %: 5.8 %
HCT: 27.6 % — ABNORMAL LOW (ref 40.0–52.0)
Lymphocyte #: 1 x10 3/mm (ref 1.0–3.6)
Lymphocyte %: 35.2 %
MCH: 33.7 pg (ref 26.0–34.0)
Monocyte %: 11.5 %
Neutrophil %: 46.5 %
Platelet: 53 x10 3/mm — ABNORMAL LOW (ref 150–440)

## 2012-08-05 LAB — BASIC METABOLIC PANEL
Chloride: 101 mmol/L (ref 98–107)
Co2: 27 mmol/L (ref 21–32)
Creatinine: 1.15 mg/dL (ref 0.60–1.30)
EGFR (Non-African Amer.): >60
Osmolality: 285 (ref 275–301)
Potassium: 4.4 mmol/L (ref 3.5–5.1)

## 2012-08-12 LAB — CBC CANCER CENTER
Basophil #: 0 x10 3/mm (ref 0.0–0.1)
Eosinophil #: 0.1 x10 3/mm (ref 0.0–0.7)
Eosinophil %: 3 %
MCH: 33.5 pg (ref 26.0–34.0)
MCHC: 33.6 g/dL (ref 32.0–36.0)
MCV: 100 fL (ref 80–100)
Neutrophil #: 1.5 x10 3/mm (ref 1.4–6.5)
Neutrophil %: 50.1 %
Platelet: 62 x10 3/mm — ABNORMAL LOW (ref 150–440)
RBC: 2.92 10*6/uL — ABNORMAL LOW (ref 4.40–5.90)
RDW: 19.5 % — ABNORMAL HIGH (ref 11.5–14.5)
WBC: 3.1 x10 3/mm — ABNORMAL LOW (ref 3.8–10.6)

## 2012-08-12 LAB — BASIC METABOLIC PANEL
Anion Gap: 12 (ref 7–16)
BUN: 15 mg/dL (ref 7–18)
Calcium, Total: 9.6 mg/dL (ref 8.5–10.1)
Chloride: 99 mmol/L (ref 98–107)
Co2: 26 mmol/L (ref 21–32)
Creatinine: 1.23 mg/dL (ref 0.60–1.30)
EGFR (Non-African Amer.): 55 — ABNORMAL LOW
Osmolality: 281 (ref 275–301)
Potassium: 4.3 mmol/L (ref 3.5–5.1)
Sodium: 137 mmol/L (ref 136–145)

## 2012-08-19 LAB — CBC CANCER CENTER
Basophil #: 0 x10 3/mm (ref 0.0–0.1)
Eosinophil %: 1.1 %
HCT: 30.3 % — ABNORMAL LOW (ref 40.0–52.0)
HGB: 9.9 g/dL — ABNORMAL LOW (ref 13.0–18.0)
MCH: 32.2 pg (ref 26.0–34.0)
Monocyte %: 14.6 %
Neutrophil #: 3.6 x10 3/mm (ref 1.4–6.5)
Neutrophil %: 61.3 %
RBC: 3.08 10*6/uL — ABNORMAL LOW (ref 4.40–5.90)
RDW: 19 % — ABNORMAL HIGH (ref 11.5–14.5)
WBC: 5.8 x10 3/mm (ref 3.8–10.6)

## 2012-08-26 LAB — BASIC METABOLIC PANEL
BUN: 14 mg/dL (ref 7–18)
Chloride: 97 mmol/L — ABNORMAL LOW (ref 98–107)
Co2: 29 mmol/L (ref 21–32)
EGFR (African American): >60
EGFR (Non-African Amer.): 56 — ABNORMAL LOW
Glucose: 217 mg/dL — ABNORMAL HIGH (ref 65–99)
Osmolality: 275 (ref 275–301)
Potassium: 4.9 mmol/L (ref 3.5–5.1)
Sodium: 134 mmol/L — ABNORMAL LOW (ref 136–145)

## 2012-08-26 LAB — HEPATIC FUNCTION PANEL A (ARMC)
Albumin: 3.2 g/dL — ABNORMAL LOW (ref 3.4–5.0)
Alkaline Phosphatase: 232 U/L — ABNORMAL HIGH (ref 50–136)
Bilirubin, Direct: 0.4 mg/dL — ABNORMAL HIGH (ref 0.00–0.20)
Bilirubin,Total: 0.8 mg/dL (ref 0.2–1.0)
SGOT(AST): 113 U/L — ABNORMAL HIGH (ref 15–37)
SGPT (ALT): 102 U/L — ABNORMAL HIGH (ref 12–78)
Total Protein: 7.7 g/dL (ref 6.4–8.2)

## 2012-08-26 LAB — CBC CANCER CENTER
Basophil #: 0 x10 3/mm (ref 0.0–0.1)
Basophil %: 0.5 %
Eosinophil #: 0.1 x10 3/mm (ref 0.0–0.7)
Eosinophil %: 1.9 %
HCT: 32.6 % — ABNORMAL LOW (ref 40.0–52.0)
HGB: 10.7 g/dL — ABNORMAL LOW (ref 13.0–18.0)
Lymphocyte #: 1.6 x10 3/mm (ref 1.0–3.6)
Lymphocyte %: 38.1 %
MCV: 99 fL (ref 80–100)
Monocyte %: 14 %
Neutrophil #: 1.9 x10 3/mm (ref 1.4–6.5)
Neutrophil %: 45.5 %
Platelet: 80 x10 3/mm — ABNORMAL LOW (ref 150–440)
RBC: 3.3 10*6/uL — ABNORMAL LOW (ref 4.40–5.90)

## 2012-08-31 ENCOUNTER — Ambulatory Visit: Payer: Self-pay | Admitting: Internal Medicine

## 2012-09-09 LAB — HEPATIC FUNCTION PANEL A (ARMC)
Albumin: 3.2 g/dL — ABNORMAL LOW (ref 3.4–5.0)
Alkaline Phosphatase: 165 U/L — ABNORMAL HIGH (ref 50–136)
Bilirubin, Direct: 0.4 mg/dL — ABNORMAL HIGH (ref 0.00–0.20)
Bilirubin,Total: 1 mg/dL (ref 0.2–1.0)
SGOT(AST): 82 U/L — ABNORMAL HIGH (ref 15–37)
Total Protein: 7.3 g/dL (ref 6.4–8.2)

## 2012-09-09 LAB — CBC CANCER CENTER
Basophil #: 0 x10 3/mm (ref 0.0–0.1)
Basophil %: 0.8 %
Eosinophil #: 0.2 x10 3/mm (ref 0.0–0.7)
Eosinophil %: 6 %
HCT: 28.1 % — ABNORMAL LOW (ref 40.0–52.0)
HGB: 9.6 g/dL — ABNORMAL LOW (ref 13.0–18.0)
Lymphocyte #: 1.3 x10 3/mm (ref 1.0–3.6)
Lymphocyte %: 34.8 %
MCV: 99 fL (ref 80–100)
Monocyte #: 0.5 x10 3/mm (ref 0.2–1.0)
Neutrophil %: 44.1 %
Platelet: 75 x10 3/mm — ABNORMAL LOW (ref 150–440)
RBC: 2.86 10*6/uL — ABNORMAL LOW (ref 4.40–5.90)
WBC: 3.6 x10 3/mm — ABNORMAL LOW (ref 3.8–10.6)

## 2012-09-09 LAB — BASIC METABOLIC PANEL
Anion Gap: 12 (ref 7–16)
BUN: 17 mg/dL (ref 7–18)
Calcium, Total: 9.2 mg/dL (ref 8.5–10.1)
Co2: 29 mmol/L (ref 21–32)
Creatinine: 1.32 mg/dL — ABNORMAL HIGH (ref 0.60–1.30)
EGFR (African American): 59 — ABNORMAL LOW
EGFR (Non-African Amer.): 51 — ABNORMAL LOW
Osmolality: 291 (ref 275–301)
Potassium: 4.5 mmol/L (ref 3.5–5.1)
Sodium: 143 mmol/L (ref 136–145)

## 2012-09-14 LAB — CBC CANCER CENTER
Basophil %: 0.8 %
HGB: 10.4 g/dL — ABNORMAL LOW (ref 13.0–18.0)
Lymphocyte #: 1.6 x10 3/mm (ref 1.0–3.6)
MCHC: 33.8 g/dL (ref 32.0–36.0)
MCV: 98 fL (ref 80–100)
Monocyte %: 6.2 %
Neutrophil %: 46.2 %
WBC: 3.8 x10 3/mm (ref 3.8–10.6)

## 2012-09-14 LAB — BASIC METABOLIC PANEL
Chloride: 98 mmol/L (ref 98–107)
Co2: 27 mmol/L (ref 21–32)
Creatinine: 1.2 mg/dL (ref 0.60–1.30)
EGFR (Non-African Amer.): 57 — ABNORMAL LOW
Glucose: 161 mg/dL — ABNORMAL HIGH (ref 65–99)
Osmolality: 276 (ref 275–301)
Potassium: 4.4 mmol/L (ref 3.5–5.1)
Sodium: 134 mmol/L — ABNORMAL LOW (ref 136–145)

## 2012-09-16 LAB — BASIC METABOLIC PANEL
BUN: 22 mg/dL — ABNORMAL HIGH (ref 7–18)
Chloride: 97 mmol/L — ABNORMAL LOW (ref 98–107)
Co2: 26 mmol/L (ref 21–32)
Creatinine: 1.15 mg/dL (ref 0.60–1.30)
Osmolality: 271 (ref 275–301)
Potassium: 4.4 mmol/L (ref 3.5–5.1)
Sodium: 131 mmol/L — ABNORMAL LOW (ref 136–145)

## 2012-09-16 LAB — MAGNESIUM: Magnesium: 1.5 mg/dL — ABNORMAL LOW

## 2012-09-16 LAB — CBC CANCER CENTER
Eosinophil #: 0.1 x10 3/mm (ref 0.0–0.7)
Eosinophil %: 3 %
HCT: 30.1 % — ABNORMAL LOW (ref 40.0–52.0)
Lymphocyte #: 1.9 x10 3/mm (ref 1.0–3.6)
Lymphocyte %: 53 %
MCHC: 33.9 g/dL (ref 32.0–36.0)
MCV: 98 fL (ref 80–100)
Monocyte #: 0.4 x10 3/mm (ref 0.2–1.0)
Neutrophil %: 31.6 %
Platelet: 60 x10 3/mm — ABNORMAL LOW (ref 150–440)
RBC: 3.08 10*6/uL — ABNORMAL LOW (ref 4.40–5.90)
WBC: 3.6 x10 3/mm — ABNORMAL LOW (ref 3.8–10.6)

## 2012-09-23 LAB — CBC CANCER CENTER
Basophil #: 0.1 x10 3/mm (ref 0.0–0.1)
Basophil %: 1.1 %
HCT: 30.2 % — ABNORMAL LOW (ref 40.0–52.0)
Lymphocyte #: 2.1 x10 3/mm (ref 1.0–3.6)
Lymphocyte %: 41.6 %
MCH: 32.6 pg (ref 26.0–34.0)
MCV: 98 fL (ref 80–100)
Monocyte #: 0.4 x10 3/mm (ref 0.2–1.0)
Monocyte %: 8 %
Neutrophil #: 2.4 x10 3/mm (ref 1.4–6.5)
Neutrophil %: 47.5 %
RDW: 19 % — ABNORMAL HIGH (ref 11.5–14.5)

## 2012-09-23 LAB — BASIC METABOLIC PANEL
Anion Gap: 8 (ref 7–16)
BUN: 19 mg/dL — ABNORMAL HIGH (ref 7–18)
Chloride: 101 mmol/L (ref 98–107)
EGFR (African American): >60
EGFR (Non-African Amer.): >60
Glucose: 183 mg/dL — ABNORMAL HIGH (ref 65–99)
Potassium: 4.8 mmol/L (ref 3.5–5.1)
Sodium: 133 mmol/L — ABNORMAL LOW (ref 136–145)

## 2012-09-23 LAB — MAGNESIUM: Magnesium: 2.2 mg/dL

## 2012-09-30 ENCOUNTER — Ambulatory Visit: Payer: Self-pay | Admitting: Internal Medicine

## 2012-09-30 LAB — CBC CANCER CENTER
Basophil #: 0 x10 3/mm (ref 0.0–0.1)
Basophil %: 0.3 %
Eosinophil #: 0.1 x10 3/mm (ref 0.0–0.7)
Lymphocyte %: 63.8 %
MCH: 32.6 pg (ref 26.0–34.0)
MCHC: 33.6 g/dL (ref 32.0–36.0)
MCV: 97 fL (ref 80–100)
Monocyte %: 13.3 %
RBC: 3.01 10*6/uL — ABNORMAL LOW (ref 4.40–5.90)
RDW: 18 % — ABNORMAL HIGH (ref 11.5–14.5)
WBC: 3.8 x10 3/mm (ref 3.8–10.6)

## 2012-09-30 LAB — BASIC METABOLIC PANEL
Anion Gap: 12 (ref 7–16)
Chloride: 101 mmol/L (ref 98–107)
Creatinine: 1.07 mg/dL (ref 0.60–1.30)
EGFR (Non-African Amer.): >60
Glucose: 163 mg/dL — ABNORMAL HIGH (ref 65–99)
Osmolality: 279 (ref 275–301)
Sodium: 137 mmol/L (ref 136–145)

## 2012-09-30 LAB — MAGNESIUM: Magnesium: 1.8 mg/dL

## 2012-10-07 LAB — BASIC METABOLIC PANEL
Anion Gap: 9 (ref 7–16)
Calcium, Total: 9 mg/dL (ref 8.5–10.1)
Chloride: 99 mmol/L (ref 98–107)
Creatinine: 1.05 mg/dL (ref 0.60–1.30)
Potassium: 4.4 mmol/L (ref 3.5–5.1)
Sodium: 135 mmol/L — ABNORMAL LOW (ref 136–145)

## 2012-10-07 LAB — CBC CANCER CENTER
Basophil #: 0 x10 3/mm (ref 0.0–0.1)
Eosinophil #: 0 x10 3/mm (ref 0.0–0.7)
Eosinophil %: 0.3 %
HGB: 8.9 g/dL — ABNORMAL LOW (ref 13.0–18.0)
Lymphocyte #: 1.4 x10 3/mm (ref 1.0–3.6)
MCHC: 33.7 g/dL (ref 32.0–36.0)
MCV: 98 fL (ref 80–100)
Monocyte #: 0.8 x10 3/mm (ref 0.2–1.0)
RBC: 2.69 10*6/uL — ABNORMAL LOW (ref 4.40–5.90)
WBC: 5.2 x10 3/mm (ref 3.8–10.6)

## 2012-10-31 ENCOUNTER — Ambulatory Visit: Payer: Self-pay | Admitting: Internal Medicine

## 2012-10-31 DEATH — deceased

## 2013-11-23 IMAGING — PT NM PET TUM IMG LTD AREA
1 of 5 series · 9 of 25 positions shown · non-contrast
Comparison: none

REASON FOR EXAM: lung mass
COMMENTS:

[Series 3: ct wb 3.0 b30f · axial · 3.0mm · 0.98mm/px · z∈[-1415,-627]mm · 9 of 494 slices shown]
[im 50/494  soft-tissue]
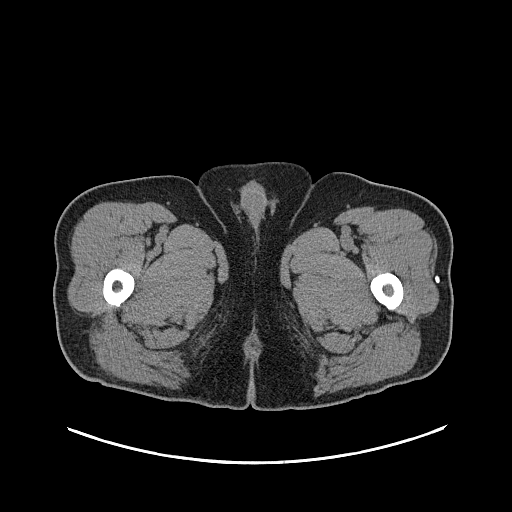
[im 99/494  soft-tissue]
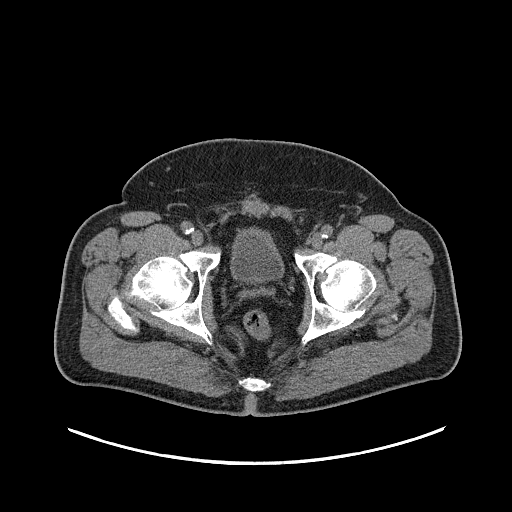
[im 148/494  soft-tissue]
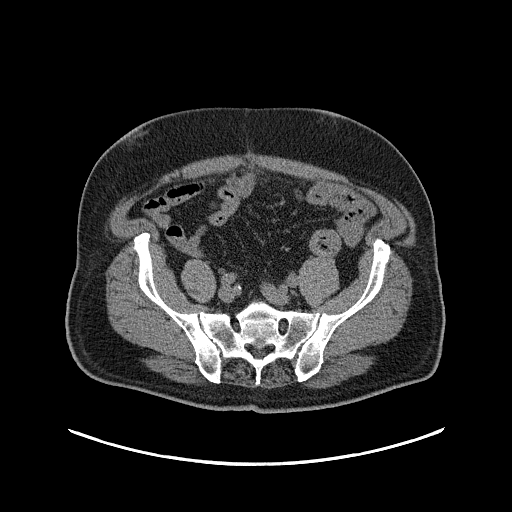
[im 198/494  soft-tissue]
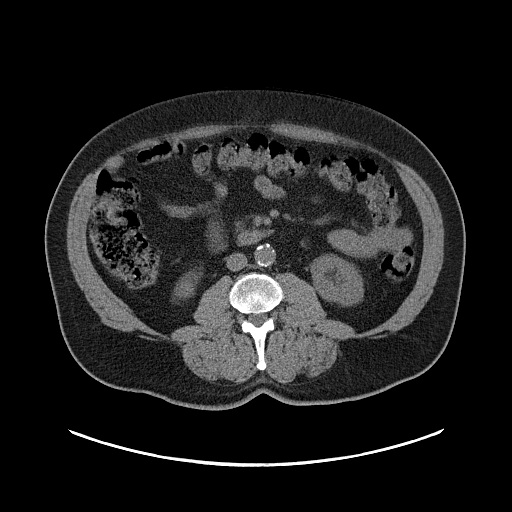
[im 247/494  soft-tissue]
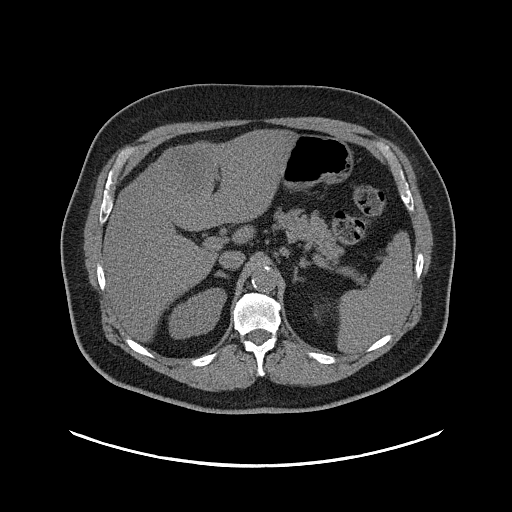
[im 296/494  soft-tissue]
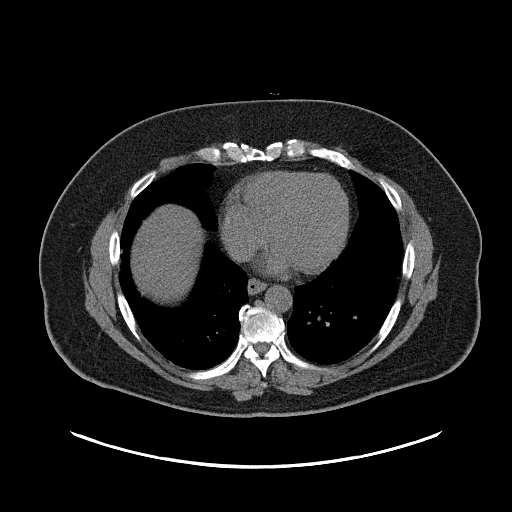
[im 346/494  soft-tissue]
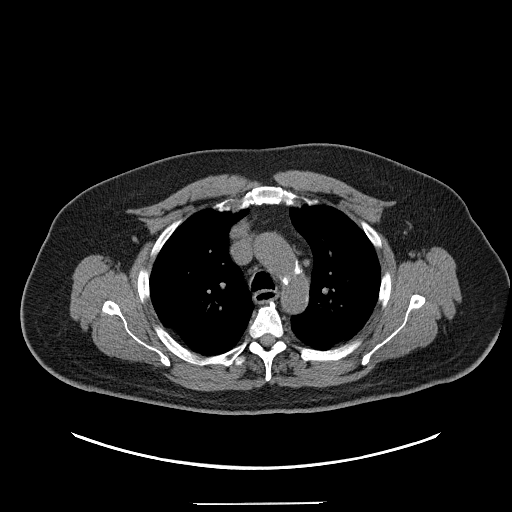
[im 395/494  soft-tissue]
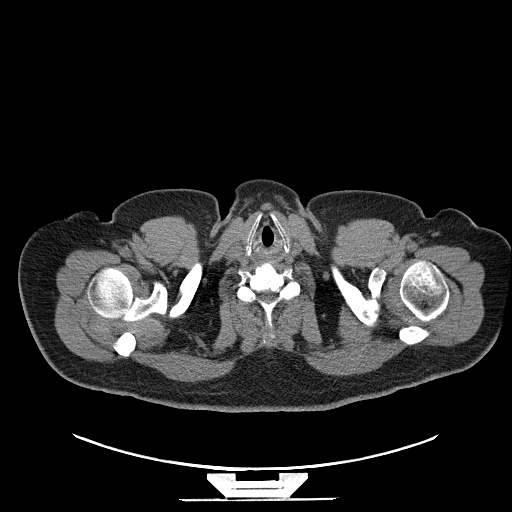
[im 444/494  soft-tissue]
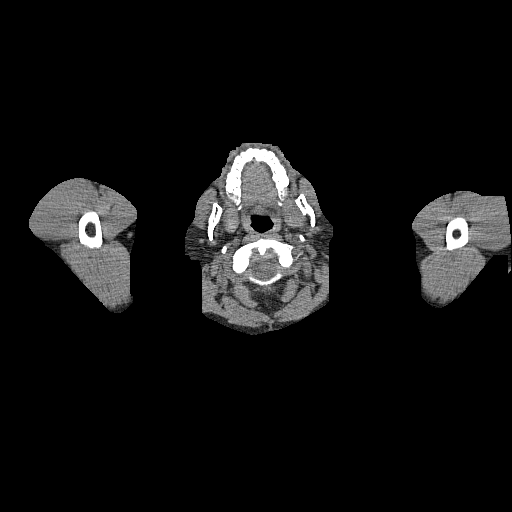

[9 of 25 positions shown; findings below may reference images not displayed]

PROCEDURE:     PET - PET/CT DX LUNG CA  - December 18, 2011  [DATE]

RESULT:

PROCEDURE:  Total Body PET was performed in conjunction with a nonattenuated
CT status post left antecubital injection of 12.89 mCi of F18-FDG.
Injection time was at [DATE], scan start time at [DATE] and scan end time
at [DATE]. The patient's fasting blood glucose was measured at 154 mg/dl.
FINDINGS: Appropriate bio-distribution is identified in the region of the
base of the brain, heart, kidneys, bowel, and spleen. An area of intense
hypermetabolic activity projects within a soft tissue mass within the
posterior lateral aspect of the base of the lateral segment of the right
middle lobe. Areas of intense hypermetabolic activity are also identified
within areas of mediastinal adenopathy. The anterior mediastinal lymph node
demonstrates an SUV of 5.52. The hypermetabolic mass within the lung
demonstrates an SUV of 8.08. A subcarinal lymph node is identified
demonstrating an SUV of 5.19. The mediastinal activity previously described
is within enlarged lymph nodes. There are also findings concerning for a
prominent right hilar lymph node demonstrating an SUV of 3.67. Multiple foci
of intense hypermetabolic activity are identified within the liver. The
largest is appreciated within the apex of the lateral segment of the left
lobe of the liver demonstrating a central area of photopenia concerning for
necrosis. Multiple smaller areas are scattered throughout the liver. An area
of focal hypermetabolic activity projects within the medullary portion of
the proximal left femur. This area demonstrates an SUV of 5.58.
IMPRESSION: Considering the distribution of disease, these findings
likely represent primary lung neoplasia with liver and mediastinal
metastases as well as possible metastatic disease within the medullary
portion of the shaft of the left femur.

## 2014-09-22 NOTE — Consult Note (Signed)
Reason for Visit: This 81 year old Male patient presents to the clinic for initial evaluation of  stage IV non-small cell lung cancer .   Referred by Dr. Sherrlyn Hock.  Diagnosis:  Chief Complaint/Diagnosis   81 year old male with stage IV small cell lung cancer with bone metastasis under hospice care and apparently terminal  Imaging Report bone scan reviewed   Referral Report clinical notes reviewed   Planned Treatment Regimen home hospice care   HPI   patient is a 81 year old male diagnosed in July of 2013 the right middle lobe lung nodule multiple liver metastasis diagnosed with stage IV small cell lung cancer. And treated with carboplatin VP-16 with progression of disease in January 2014 on second line chemotherapy with irotecan.cis-platinum. He has been complaining of some low back pain and also rib pain has multiple areas on bone scan of metastatic involvement. He was brought today by rescue for consideration of palliative radiation therapy. He appears terminal. He is involved call hospice home at this time  Past Hx:    Cancer:    hypothyroid:    HTN:    diabetes:   Past, Family and Social History:  Past Medical History positive   Cardiovascular hyperlipidemia; hypertension   Endocrine diabetes mellitus; hypothyroidism   Neurological/Psychiatric diabetic neuropathy   Past Surgical History lumbar disc surgery, 6 colon resection 86 for diverticulitis   Family History positive   Family History Comments family history for coronary vascular heart disease anemia. Father with lung cancer   Social History positive   Social History Comments 50-pack-year smoking history quit 10 years prior   Additional Past Medical and Surgical History accompanied by multiple family members today   Allergies:   Penicillin: Rash  Vioxx: GI Distress  CT Dye: Rash  Home Meds:  Home Medications: Medication Instructions Status  morphine 20 mg/mL oral concentrate Take 1 - 2 mL  (20 - 40 mg)  orally every 1 - 2 hours as needed for pain. HOSPICE PATIENT. Active  ALPRAZolam 0.25 mg oral tablet 1 - 2 tablets orally 3 times a day as needed for anxiety Active   Review of Systems:  Performance Status (ECOG) 3   Skin negative   Breast negative   Ophthalmologic negative   ENMT negative   Respiratory and Thorax see HPI   Cardiovascular negative   Gastrointestinal negative   Genitourinary negative   Musculoskeletal negative   Neurological negative   Psychiatric negative   Hematology/Lymphatics negative   Endocrine negative   Allergic/Immunologic negative   Nursing Notes:  Nursing Vital Signs and Chemo Nursing Nursing Notes: *CC Vital Signs Flowsheet:   15-May-14 12:58  Temp Temperature 97.3  Pulse Pulse 111  Respirations Respirations 22  SBP SBP 126  DBP DBP 72  Pain Scale (0-10)  ?  Height (cm) centimeters 171.7   Physical Exam:  General/Skin/HEENT:  Skin normal   Eyes normal   ENMT normal   Head and Neck normal   Additional PE well-developed male on nasal oxygen markedly of tongue did. Patient does not respond to verbal commands. Lungs are clear to A&P he is bradycardic with the regular rate and rhythm. Abdomen is benign. Lungs are clear to A&P.   Breasts/Resp/CV/GI/GU:  Respiratory and Thorax normal   Gastrointestinal normal   Genitourinary normal   MS/Neuro/Psych/Lymph:  Musculoskeletal normal   Lymphatics normal   Other Results:  Radiology Results: LabUnknown:    12-May-14 14:08, Bone Scan Whole Body (Part 2 of 2)  PACS Image   Nuclear  Med:  Bone Scan Whole Body (Part 2 of 2)   REASON FOR EXAM:    lung cancer  COMMENTS:       PROCEDURE: KNM - KNM BONE WB 3HR 2 OF 2  - Oct 11 2012  2:08PM     RESULT: The patient received 21.28 mCi of technetium 2559m labeled MDP.   There is adequate uptake of the radiopharmaceutical by the skeleton.   There is adequate soft tissue clearance and renal activity.    There is a small focus of  increased uptake in the left parietal bone and   in the right occipital bone. There are patchy areas of increased uptake   within the posterior elements of the thoracic spine with more focal   increased uptake in the body of T11. There is a focus of increased uptake   in the posterior aspect of the left tenth rib with patchy areas of   increased uptake noted in the anterior aspects of multiple ribs     bilaterally. There is a small amount of increased uptake in the posterior   elements of the lumbar spine which are likely degenerative. There is   increased uptake in the left aspect of the sacrum and in the left iliac   bone. There is increased uptake in the intertrochanteric regions of both   hips especially on the left as well as in the proximal femoral shafts.   There is focal increased uptake in the Achilles region of the calcaneus   on the left.    IMPRESSION:  There are multiple foci of increased uptake within the ribs,   thoracic vertebral bodies, the sacrum, both hips and proximal femurs, and   not mentioned above small amounts of increased uptake are present in the   humeral shafts. These findings are consistent with widespread metastatic   disease.     Dictation Site: 2    Verified By: Quadarius A. SwazilandJORDAN, M.D., MD   Relevent Results:   Relevant Scans and Labs bone scan reviewed   Assessment and Plan: Impression:   at this time a long talk with the family about his terminal nature. Do not believe palliative radiation is indicated since the patient has probably less than 2 weeks of survival. Certainly the benefits of radiation would not be intact on such a short survival course. I've encouraged him to continue with home hospice care. I discussed the case personally with Dr. Sherrlyn HockPandit who is in agreement.  I would like to take this opportunity to thank you for allowing me to continue to participate in this patient's care.  Electronic Signatures: Trenell Moxey, Gordy CouncilmanGlenn S (MD)  (Signed  15-May-14 15:36)  Authored: HPI, Diagnosis, Past Hx, PFSH, Allergies, Home Meds, ROS, Nursing Notes, Physical Exam, Other Results, Relevent Results, Encounter Assessment and Plan   Last Updated: 15-May-14 15:36 by Rebeca Alerthrystal, Yula Crotwell S (MD)

## 2014-09-22 NOTE — H&P (Signed)
PATIENT NAME:  Patrick Horn, Patrick Horn MR#:  [id number] DATE OF BIRTH:  08-Dec-1931  DATE OF ADMISSION:  07/02/2012  PRIMARY CARE PHYSICIAN:  John B. Danne HarborWalker, III, MD  EMERGENCY ROOM PHYSICIAN:  Rockne MenghiniAnne-Caroline Norman, MD  CHIEF COMPLAINT:  Neck pain and abdominal pain.   HISTORY OF PRESENT ILLNESS:  A 81 year old male patient with history of lung cancer, mets to liver, followed up with Dr. Sherrlyn HockPandit. He also has a history of hypertension and diabetes who came in because of abdominal pain mainly in the epigastric area. No radiation. The patient has constipation. Last bowel movement was 1 week ago. The patient tried milk of magnesia which did not help. Last chemotherapy was 10 days ago. The patient says usually after chemotherapy he feels constipated. The patient also complains of neck pain mainly in the middle of the neck, more on the right side, unable to move the neck towards the right side with significant pain. The patient has had no injury to the neck and takes ibuprofen 600 mg almost 2 to 3 times a day for the past 3 days with some relief. The patient denies any tingling or numbness to the hands. The patient does have a history of degenerative disk disease. The patient received Valium 5 mg IV push _____ dose and he also received 0.5 mg of Dilaudid which helped him with the pain.   PAST MEDICAL HISTORY:  Significant for a history of colon cancer with metastasis to lungs and the patient has stage IV metastatic lung cancer diagnosed in July last year. Right now, he is getting chemotherapy; last chemotherapy was 10 days ago. The patient follows up with Dr. Sherrlyn HockPandit. Other diagnoses include diabetes, diabetic neuropathy, hypertension, hypothyroidism, hyperlipidemia, sleep apnea on CPAP, history of degenerative disk disease of the spine and lumbar surgeries.   PAST SURGICAL HISTORY:  Lumbar surgery for bulging disk in 2007, history of partial colectomy for diverticulitis, history of appendectomy.   FAMILY HISTORY:   Significant for lung cancer in the father.   SOCIAL HISTORY:  The patient is an ex-smoker, quit 10 years ago, but he smoked 2 packs per day for 40 years. Occasional alcohol.   ALLERGIES:  PENICILLIN, BIAXIN AND IV DYES.   HOME MEDICATIONS:   1.  Xanax 0.25 mg 1 to 2 tablets as needed for anxiety 3 times a day. 2.  Enalapril 20 mg p.o. daily.  3.  Ibuprofen 200 mg 1 tablet every 4 hours as needed for pain.  4.  Lantus 25 units at bedtime.  5.  Levothyroxine 75 mcg p.o. daily.  6.  Lopressor as needed (dose is not written in the medication list).  7.  Metformin 1 gram p.o. b.i.d.  8.  NovoLog with sliding scale.  9.  Zofran 4 mg every 4 hours as needed for nausea.  10.  Simvastatin 40 mg daily.  11.  Venlafaxine 75 mg daily.  12.  Zantac for indigestion.  13.  Zyrtec 10 mg as needed for allergies.   REVIEW OF SYSTEMS:  CONSTITUTIONAL: Feels fatigued, had low-grade fever for last 2 days, feels weak.  EYES: No blurred vision.  ENT: No tinnitus. No epistaxis. No difficulty swallowing.  RESPIRATORY: No cough. No wheezing.  CARDIOVASCULAR: No chest pain, orthopnea or PND.  GASTROINTESTINAL: The patient does have abdominal pain and constipation. No nausea. No vomiting, but has poor appetite. Last bowel movement was a week ago.  GENITOURINARY: No dysuria.  ENDOCRINE: No polyuria. The patient has hypothyroidism.  INTEGUMENTARY: No skin rashes.  MUSCULOSKELETAL: Complains of neck pain and also left ankle has stand-on pain.  NEUROLOGIC: The patient denies any numbness or dysarthria.  PSYCHIATRIC: Has a history of anxiety and insomnia.   PHYSICAL EXAMINATION:  VITAL SIGNS: Heart rate 84, temperature 98.1, respirations 18, blood pressure 117/60, sats 96% on room air. The patient's blood pressure dropped to systolic 90 when he sat up for me to examine his neck.  GENERAL: The patient is alert, awake, oriented.  HEENT: Atraumatic, normocephalic. Pupils equal, reacting to light. Extraocular  movements are intact. ENT: The patient has no tympanic membrane congestion. No turbinate hypertrophy. No oropharyngeal erythema.  NECK: The patient does have tenderness around the midline of the neck, more on the right side, and there are tender spots around the suprascapular area on the right, but range of movement of the right shoulder is within normal limits and movement of the neck towards the right side is limited because of the pain.  LUNGS: Clear to auscultation. No wheeze. No rales.  CARDIOVASCULAR: S1, S2 regular. No murmurs.  ABDOMEN: Tender in the midepigastric region and also left upper and lower quadrant region. Bowel sounds are present. No hernias.  MUSCULOSKELETAL: As I mentioned, tenderness to the neck on the right side.  NEUROLOGIC: The patient is alert, awake, oriented. Cranial nerves II through XII are intact. Power is 5 out of 5 in upper and lower extremities. Sensation is intact. Deep tendon reflexes 2+ bilaterally.  SKIN: No skin rashes.  PSYCHIATRIC: Mood and affect are within normal limits.   DIAGNOSTIC DATA:  WBC up slightly at 13.6, hemoglobin 10, hematocrit 30.3, platelets 86. Electrolytes: Sodium 134, potassium 4.5, chloride 101, bicarbonate 27, BUN 18, creatinine 0.86, glucose 211. LFTs are within normal limits except AST is slightly elevated at 68 and total bilirubin is slightly up at 1.3. Lipase is 177. INR is 1.1. CT of the neck without contrast showed no acute abnormality. No evidence of mastoid opacification. Ultrasound of the abdomen is done for abdominal pain which showed hepatic metastasis. No acute abnormality of the gallbladder. No intrahepatic or extrahepatic dilatation. The patient also had abdominal x-ray which showed bowel gas pattern consistent with constipation and no evidence of ileus and lung mass in the inferior aspect of the upper lobe.   ASSESSMENT AND PLAN:   1.  This is a 81 year old male with history of lung cancer with mets to the liver who came in  with abdominal pain and neck pain and constipation. The patient's last bowel movement was a week ago. The patient's chemotherapy also was 10 days ago. Right now, he is constipated. He is going to be observed overnight. We will give stool softeners, Dulcolax, Colace and if needed Fleet enemas.  2.  Neck pain, likely musculoskeletal. The patient does have tenderness to palpation on the right side with decreased range of motion on the right. At this time, we will continue Skelaxin and also pain killers with morphine and see how he responds. If he is still having pain then we can involve orthopedics on the case.  3.  The patient did get better with the diazepam that was given in the ER so it looks like it is muscle spasms so I ordered a heating pad as well.  4.  Diabetes mellitus, type 2. The patient's Lantus will be on hold because of his poor p.o. intake. We will continue sliding scale with coverage.  5.  Hypertension. Blood pressure is borderline. He also had an episode of hypotension when he sat  up so we are holding the blood pressure medication which is enalapril and continue intravenous fluids.  6.  Gastrointestinal prophylaxis and deep vein thrombosis prophylaxis.  7.  Thrombocytopenia likely related to chemotherapy. The patient is supposed to see Dr. Sherrlyn Hock on Monday for followup of chemotherapy. If he stays overnight tomorrow, the patient can get oncology evaluation, but I do not think he needs any emergency oncology consult at this time.   TIME SPENT:  About 50 minutes.    ____________________________ Katha Hamming, MD sk:si D: 07/02/2012 15:09:00 ET T: 07/02/2012 17:02:05 ET JOB#: [id number]  cc: Darryll Capers R. Sherrlyn Hock, MD Katha Hamming, MD, <Dictator> Letta Pate. Danne Harbor, MD     Katha Hamming MD ELECTRONICALLY SIGNED 07/27/2012 22:20

## 2014-09-22 NOTE — Consult Note (Signed)
ONCOLOGY Consult Note -  MD: Dr. Lauris Poag:  Dr.Sienna Stonehocker for Consult: Neck pain, metastatic lung cancer.  patient is a 81 year old male patient with known history of small cell lung cancer with mets to liver, recently found to have progression of liver mets and lung mass, and was planned to start next-line chemotherapy tomorrow. He has been admitted with new onset bilateral neck and lower scalp pain, low back pain, abdominal pain mainly in the epigastric area. Also has constipation. He is on muscle relaxant but still having neck pain, also has intermittent headaches. Denies any tingling or numbness to the hands or feet, no new focal weakness. The patient does have a history of degenerative disk disease. No fevers. Denies falls or trauma.   MEDICAL HISTORY:  stage IV metastatic lung cancer diagnosed in July 2013 as above. In addition, has h/o diabetes, diabetic neuropathy, hypertension, hypothyroidism, hyperlipidemia, sleep apnea on CPAP, history of degenerative disk disease of the spine and lumbar surgeries.  SURGICAL HISTORY:  Lumbar surgery for bulging disk in 2007, history of partial colectomy for diverticulitis, history of appendectomy.  HISTORY:  Significant for lung cancer in the father.  HISTORY:  ex-smoker, quit 10 years ago. Occasional alcohol.   Allergies: PCN, biaxin, IV dyes.  MEDICATIONS:   Xanax 0.25 mg 1 to 2 tablets as needed for anxiety 3 times a day. Enalapril 20 mg p.o. daily.  Ibuprofen 200 mg 1 tablet every 4 hours as needed for pain.  Lantus 25 units at bedtime.  Levothyroxine 75 mcg p.o. daily.  Lopressor as needed (dose is not written in the medication list).  Metformin 1 gram p.o. b.i.d.  NovoLog with sliding scale.  Zofran 4 mg every 4 hours as needed for nausea.  Simvastatin 40 mg daily.  Venlafaxine 75 mg daily.  Zantac for indigestion.  Zyrtec 10 mg as needed for allergies.  OF SYSTEMS: chronic fatigue. Low-grade fever for last 2 days. No tinnitus. No epistaxis. No difficulty  swallowing. has chronic cough, DOE. No hemoptysis or wheezing. No chest pain, orthopnea or PND. abdominal pain and constipation. No nausea or vomiting, but has poor appetite.  No hematuria or dysuria. No polyuria polydipsia.  No rashes. Complains of neck pain and also left ankle has stand-on pain. denies any new foical weakness, numbness or dysarthria.  EXAM:sitting in bed, tired-looking. NAD. normocephalic, atraumatic. EOMs intact. No icterusmild nonspecific tenderness over C-spine. No cervical adenopathy. Clear to auscultation. No wheeze. S1S2 regular. epigastric tenderness, liver palpable.   nonfocal, cranial nerves intact.  No skin rashes. Mood and affect are within normal limits.  WBC up slightly at 13.6, hemoglobin 10, hematocrit 30.3, platelets 86. Electrolytes: Sodium 134, potassium 4.5, chloride 101, bicarbonate 27, BUN 18, creatinine 0.86, glucose 211. LFTs are within normal limits except AST is slightly elevated at 68 and total bilirubin is slightly up at 1.3. Lipase is 177. INR is 1.1. of the neck without contrast showed no acute abnormality. of the abdomen is done for abdominal pain which showed hepatic metastasis. No acute abnormality of the gallbladder. x-ray which showed bowel gas pattern consistent with constipation and no evidence of ileus.  with known history of stage IV small cell lung cancer with metastases to the liver with recent disease progression, admitted with abdominal pain, neck pain/stiffness and constipation. Neck pain likely musculoskeletal etiology, no other signs or symptoms to suspect meningitis. Agree with ongoing supportive treamtnet. Will get MRI of C-spine and MRI brain to evaluate for occult brain or C-spine mets causing these symptoms. Agree  with muscle relaxant and morpine IV prn. Continue bowel regimen for constipation. Will continue to follow. Patient explained Sheila Oatsaobve, he is agreeable to this plan.      Electronic Signatures: Izola PricePandit, Julionna Marczak Raj (MD)  (Signed on  02-Feb-14 22:47)  Authored  Last Updated: 02-Feb-14 22:47 by Izola PricePandit, Helix Lafontaine Raj (MD)
